# Patient Record
Sex: Male | Born: 1990 | Race: Black or African American | Hispanic: No | Marital: Single
Health system: Southern US, Community
[De-identification: ages and names within clinical notes are randomized; demographics above are authoritative.]

---
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
## 2012-08-23 ENCOUNTER — Emergency Department: Payer: Self-pay | Admitting: Emergency Medicine

## 2012-09-02 ENCOUNTER — Ambulatory Visit: Payer: Self-pay | Admitting: Orthopedic Surgery

## 2012-09-02 LAB — DRUG SCREEN, URINE
Amphetamines, Ur Screen: NEGATIVE (ref ?–1000)
Barbiturates, Ur Screen: NEGATIVE (ref ?–200)
Cannabinoid 50 Ng, Ur ~~LOC~~: POSITIVE (ref ?–50)
Cocaine Metabolite,Ur ~~LOC~~: NEGATIVE (ref ?–300)
Tricyclic, Ur Screen: NEGATIVE (ref ?–1000)

## 2013-04-11 ENCOUNTER — Emergency Department: Payer: Self-pay | Admitting: Emergency Medicine

## 2013-05-10 ENCOUNTER — Encounter: Payer: Self-pay | Admitting: Family Medicine

## 2013-05-20 IMAGING — CR RIGHT HAND - COMPLETE 3+ VIEW
1 series · 3 of 3 positions shown · non-contrast
Comparison: none

REASON FOR EXAM: injury
COMMENTS:

[Series 1: pa · 0.17mm/px · 3 of 3 slices shown]
[im 1/3]
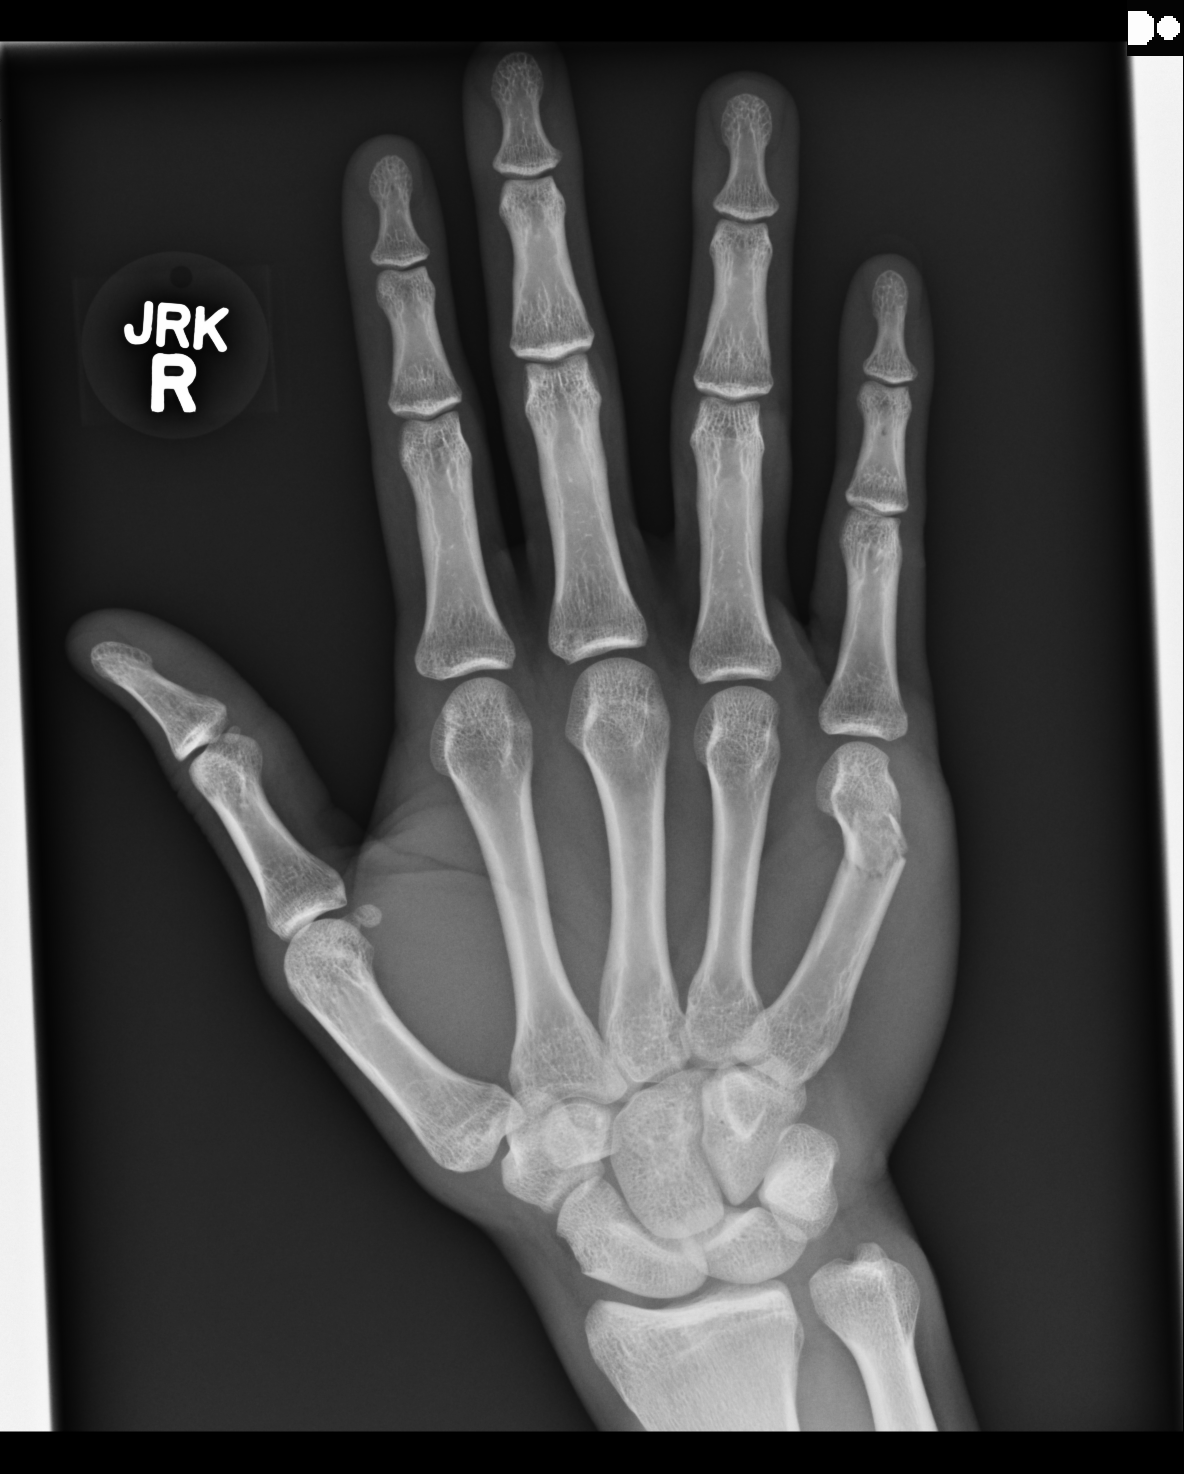
[im 2/3]
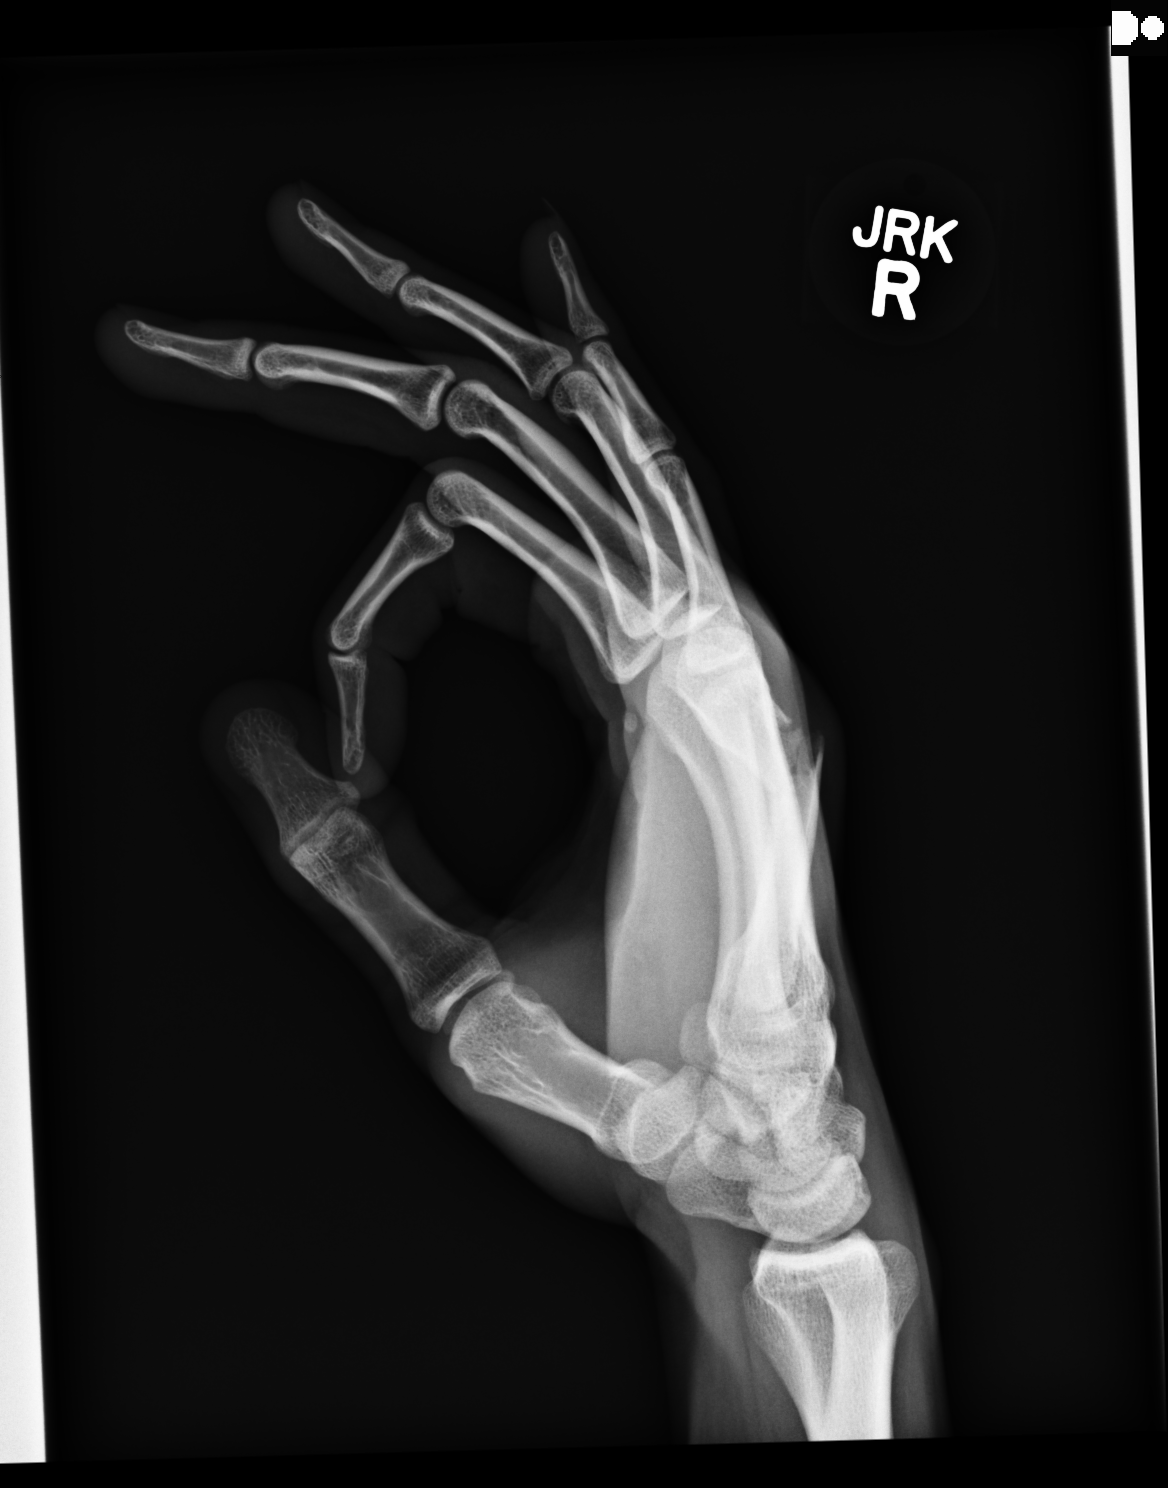
[im 3/3]
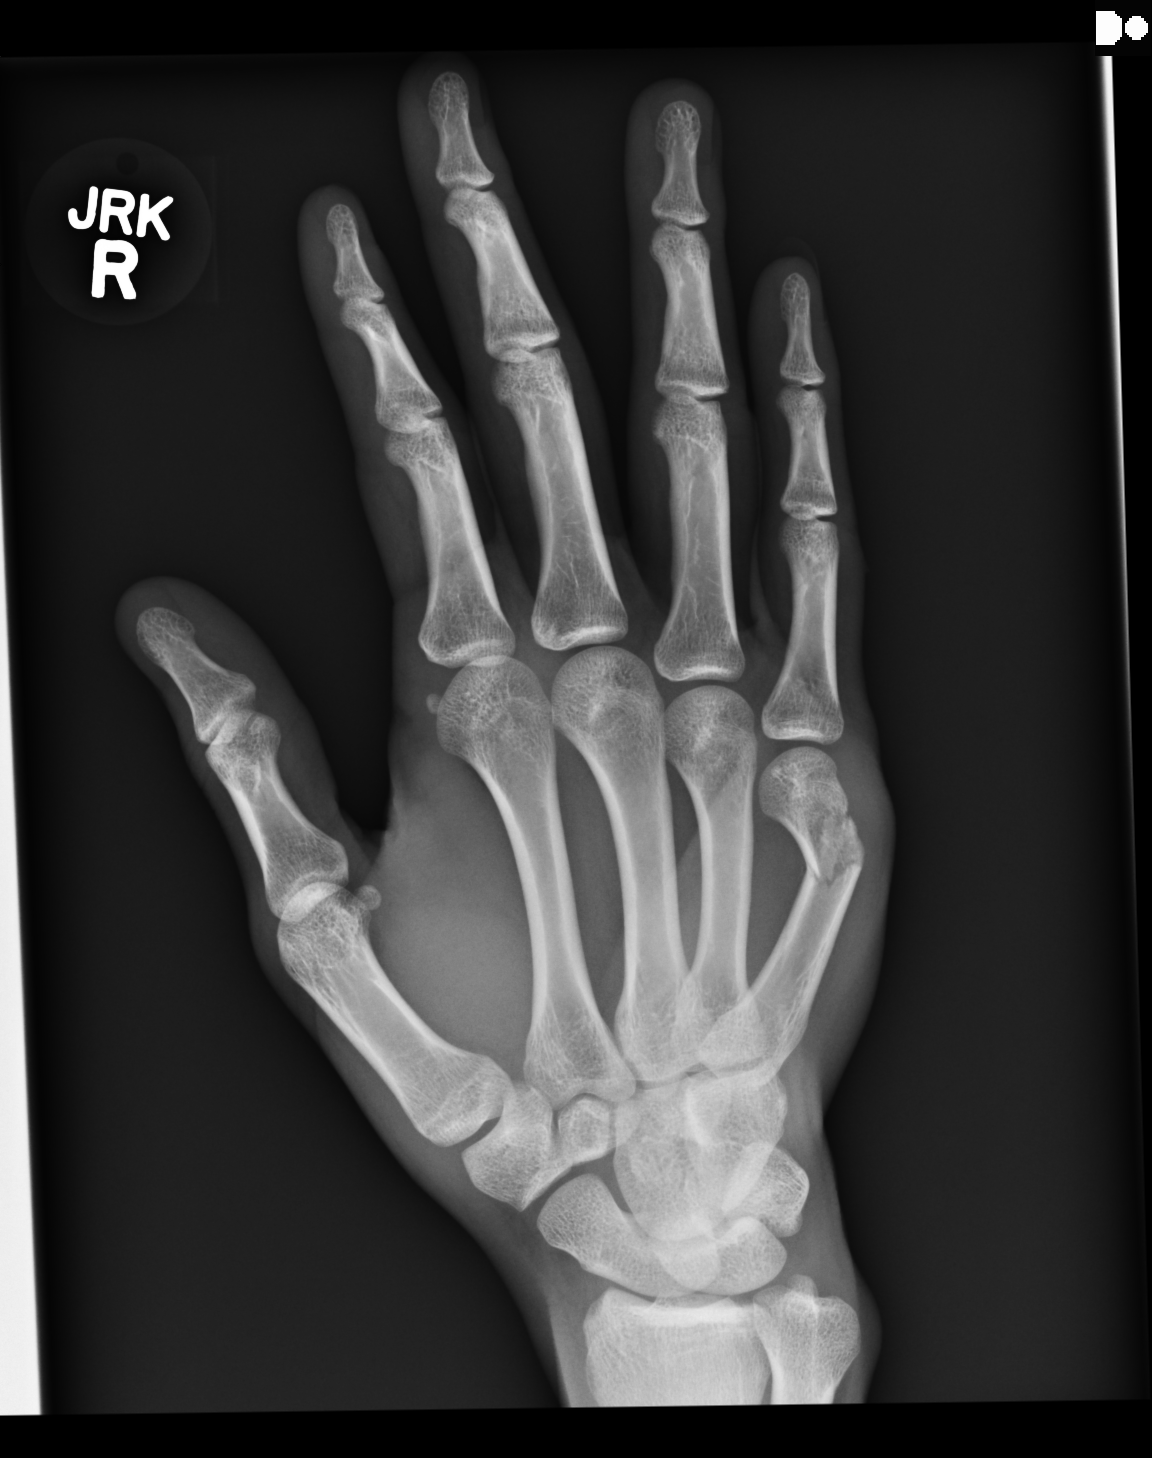

[3 of 3 positions shown; findings below may reference images not displayed]

PROCEDURE:     DXR - DXR HAND RT COMPLETE W/OBLIQUES  - August 23, 2012  [DATE]

RESULT:     Right hand images demonstrate a boxers type fracture in the
distal portion of the fifth metacarpal with dorsal angulation and a
comminuted appearance. There is slight distraction especially dorsally
because of the angulation.
IMPRESSION: Right fifth metacarpal fracture.

[REDACTED]

## 2013-05-30 IMAGING — CR RIGHT HAND - 2 VIEW
1 series · 2 of 2 positions shown · non-contrast
Comparison: none

REASON FOR EXAM: post op
COMMENTS:   LMP: (Male)

PROCEDURE:     DXR - DXR HAND RT TWO VIEWS  - September 02, 2012  [DATE]
RESULT:     The patient has undergone reduction of the distal right fifth
metacarpal fracture. Two orthopedic pins are present. The films are obtained
in plaster. There is subtle angulation at the fracture site still.

[Series 1: pa · 0.17mm/px · 2 of 2 slices shown]
[im 1/2]
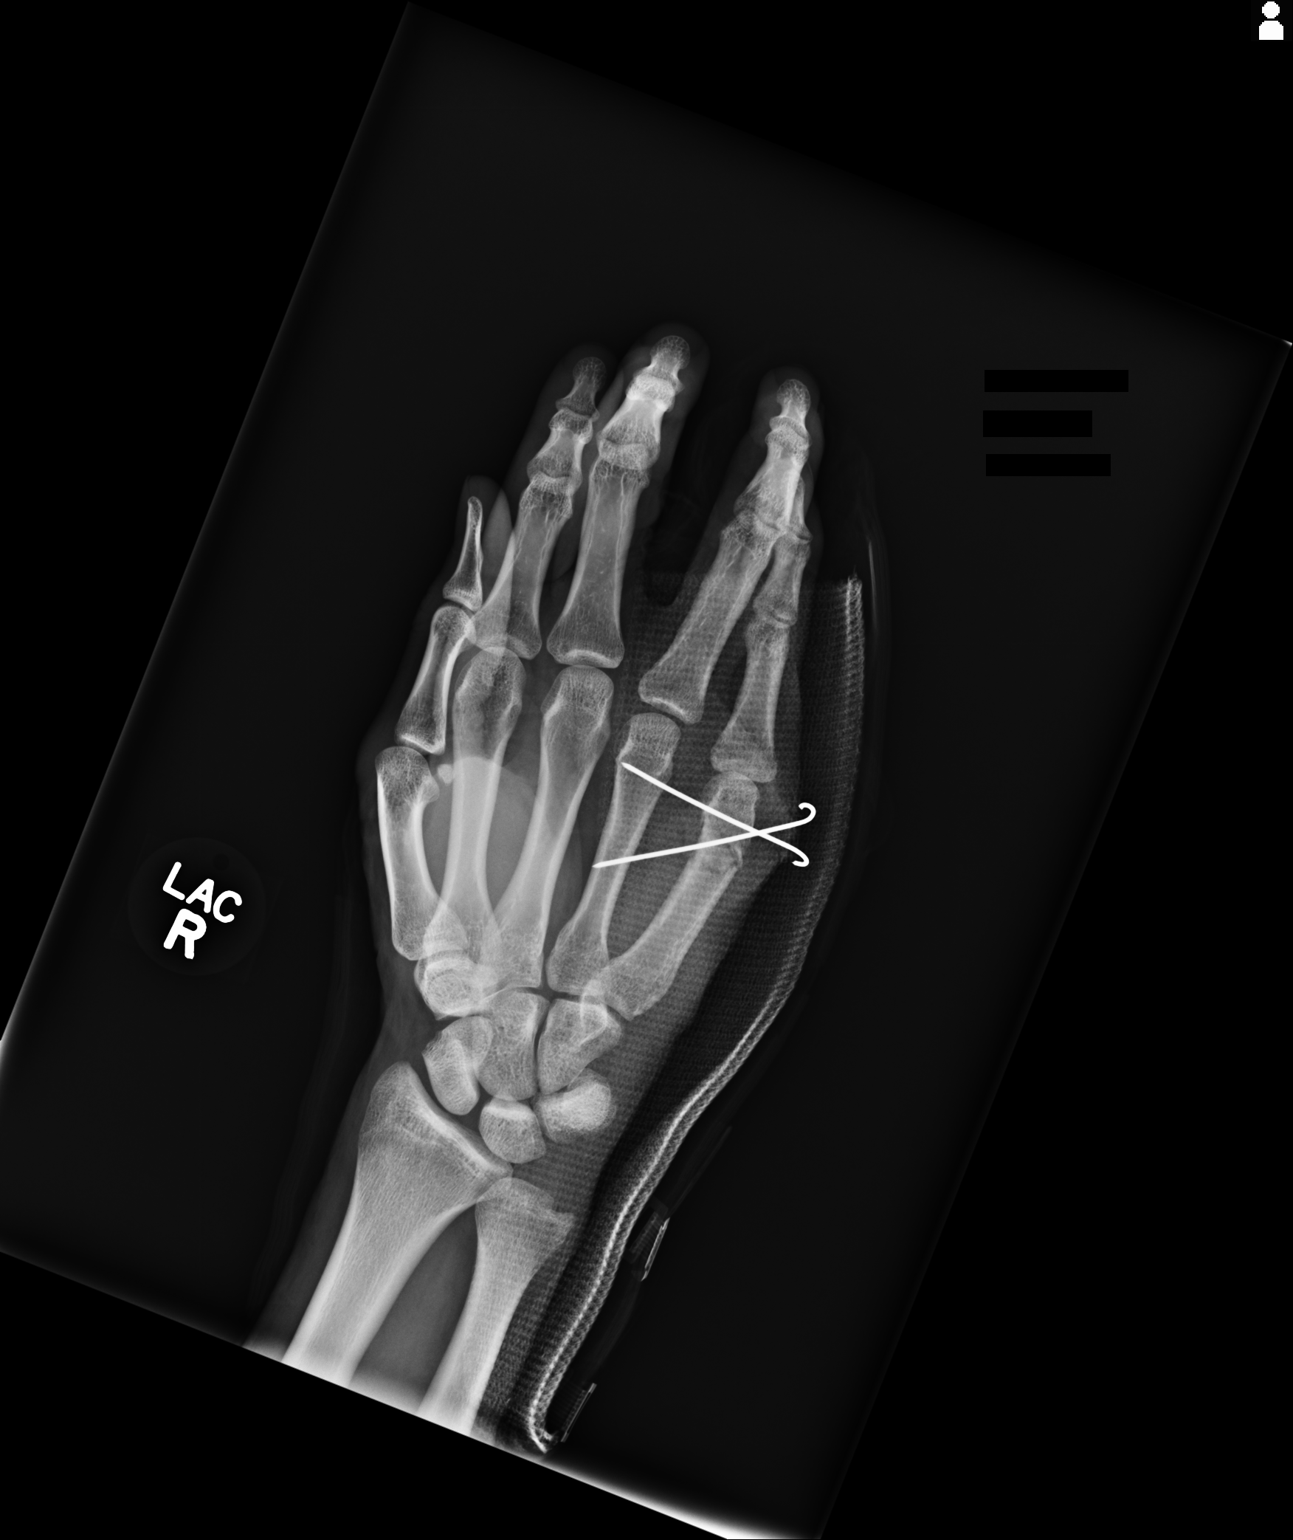
[im 2/2]
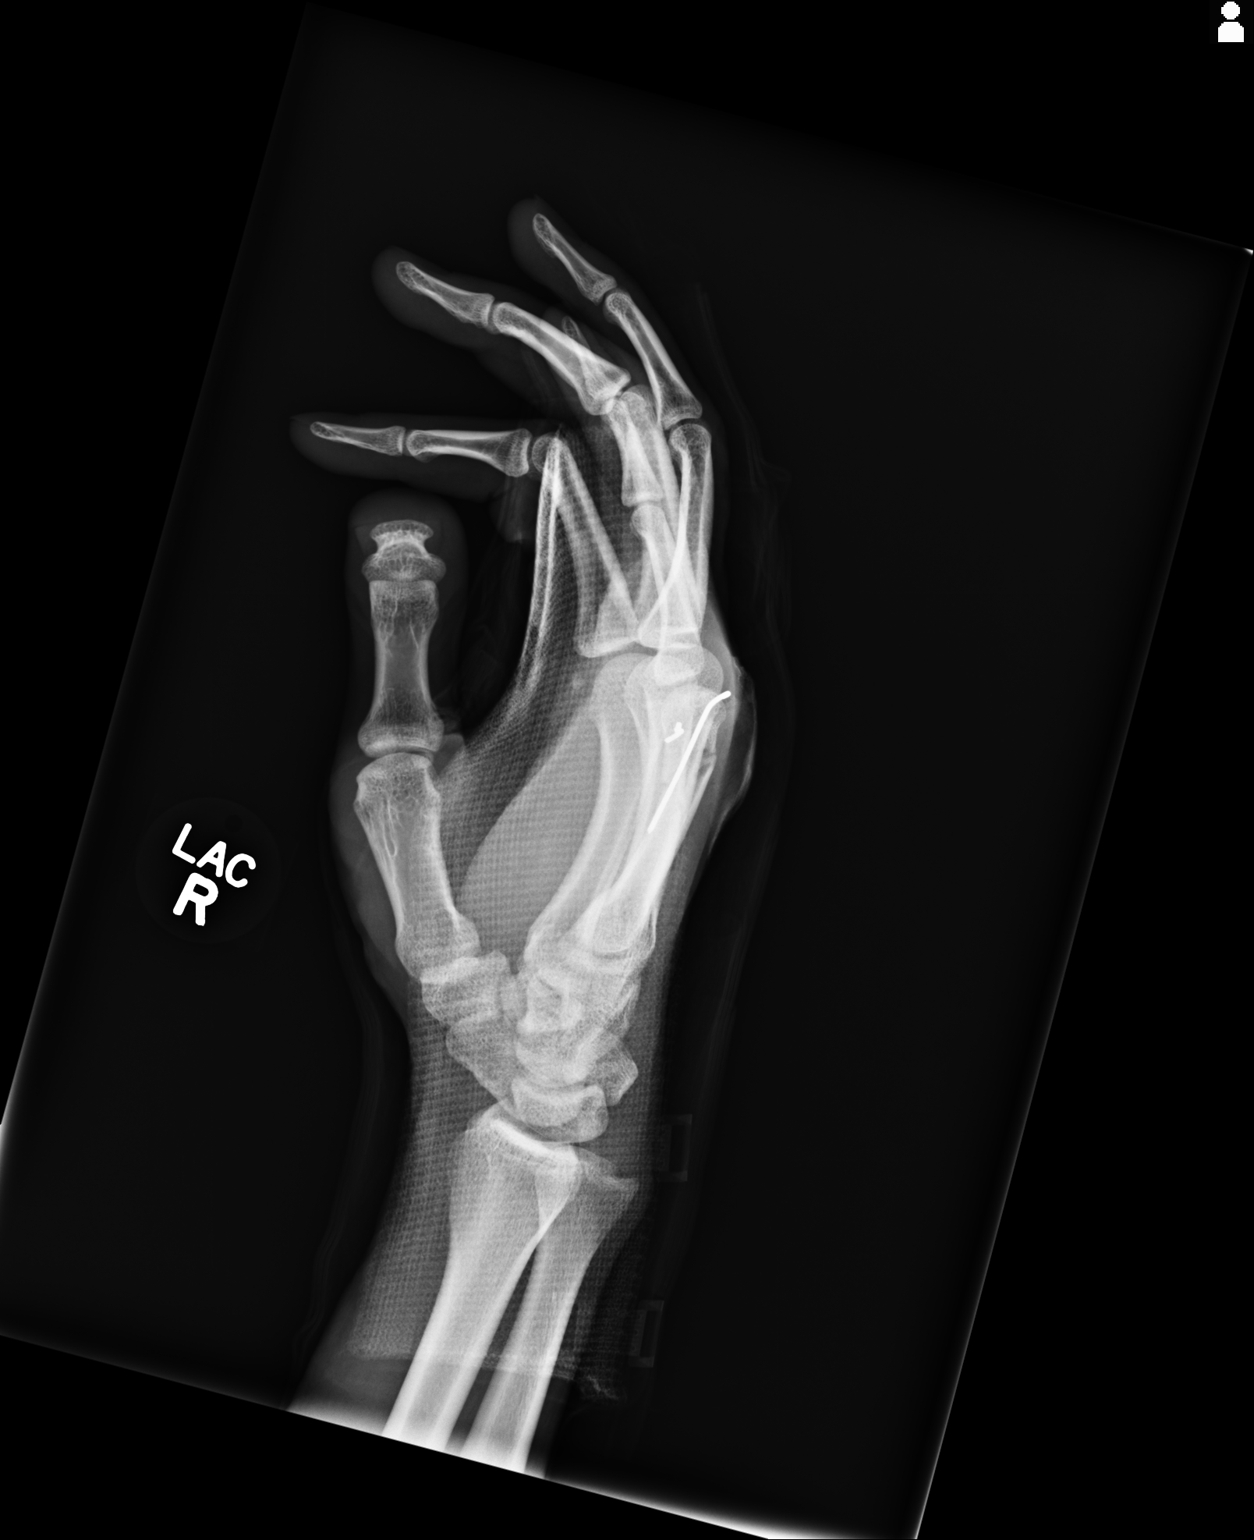

[2 of 2 positions shown; findings below may reference images not displayed]

IMPRESSION: There has been some reduction of the angulated fracture of
the distal aspect of the right fifth metacarpal. Further interpretation is
deferred to Dr. Amandeep Kaur.

[REDACTED]

## 2013-06-05 ENCOUNTER — Encounter: Payer: Self-pay | Admitting: Family Medicine
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
## 2014-08-27 NOTE — Op Note (Signed)
PATIENT NAME:  Dorian FurnaceHAITH, Banks Q MR#:  [id number] DATE OF BIRTH:  06/11/1990  DATE OF PROCEDURE:  08/17/2011  PREOPERATIVE DIAGNOSIS: Persistent pneumothorax.   POSTOPERATIVE DIAGNOSIS: Persistent pneumothorax.   PROCEDURE: Tube thoracostomy.   SURGEON: Quentin Orealph L. Ely III, MD  ANESTHESIA: Local.   DESCRIPTION OF PROCEDURE: With the patient in supine position the previous chest tube was removed and his left chest prepped with Betadine, draped with sterile towels. 20 mL of 0.5% Marcaine was injected in the area around the site of the previous tube. The rib was identified and the pleura entered with needle to help anesthetize the pleural space. 20 French chest tube with stylet was then inserted into the chest without difficulty. Air return was noted immediately. The tube was placed at 14 cm. It was attached to suction, secured in place and re-taped. Chest x-ray is pending.   ____________________________ Carmie Endalph L. Ely III, MD rle:cms D: 08/17/2011 18:12:41 ET T: 08/18/2011 08:01:58 ET JOB#: [id number]  cc: Quentin Orealph L. Ely III, MD, <Dictator> Quentin OreALPH L ELY MD ELECTRONICALLY SIGNED 08/18/2011 10:40
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
## 2014-08-27 NOTE — Op Note (Signed)
PATIENT NAME:  Thomas Hanson, Thomas Hanson MR#:  [id number] DATE OF BIRTH:  Feb 03, 1991  DATE OF PROCEDURE:  08/17/2011  PREOPERATIVE DIAGNOSIS: Left open traumatic pneumothorax.   POSTOPERATIVE DIAGNOSIS: Left open traumatic pneumothorax.   OPERATION PERFORMED: Left closed thoracostomy tube placement (anterior, 12 [place]French)   SURGEON: Claude MangesWilliam F. Mailin Coglianese, MD  ANESTHESIA: 10 mg IV morphine and local.  PROCEDURE IN DETAIL: Patient was prepped and draped in the usual sterile fashion. A small incision was made at the second intercostal space in the left midclavicular line and a 12 French trocar chest tube was placed into the chest and secured to skin with a 0 silk suture and connected to [facility] Pleur-evac suction device. Chest x-ray was obtained which showed lateral positioning of the chest tube in the chest with no worsening of the pneumothorax and still a very small stripe of pneumothorax laterally. The patient tolerated the procedure well. There were no complications.   ____________________________ Claude MangesWilliam F. Jasyah Theurer, MD wfm:cms D: 08/17/2011 00:48:25 ET T: 08/17/2011 11:09:40 ET JOB#: [id number]  cc: Claude MangesWilliam F. Joniya Boberg, MD, <Dictator> Claude MangesWILLIAM F Janei Scheff MD ELECTRONICALLY SIGNED 08/17/2011 19:32
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
## 2022-07-21 ENCOUNTER — Other Ambulatory Visit: Payer: Self-pay

## 2022-07-21 ENCOUNTER — Emergency Department
Admission: EM | Admit: 2022-07-21 | Discharge: 2022-07-21 | Disposition: A | Discharge status: Home / Self Care | Payer: BLUE CROSS/BLUE SHIELD | Attending: Emergency Medicine | Admitting: Emergency Medicine

## 2022-07-21 DIAGNOSIS — L0231 Cutaneous abscess of buttock: Secondary | ICD-10-CM | POA: Diagnosis present

## 2022-07-21 LAB — CBC WITH DIFFERENTIAL/PLATELET
Abs Immature Granulocytes: 0.04 10*3/uL (ref 0.00–0.07)
Basophils Absolute: 0.1 10*3/uL (ref 0.0–0.1)
Basophils Relative: 1 %
Eosinophils Absolute: 0.3 10*3/uL (ref 0.0–0.5)
Eosinophils Relative: 3 %
HCT: 45.5 % (ref 39.0–52.0)
Hemoglobin: 14.9 g/dL (ref 13.0–17.0)
Immature Granulocytes: 0 %
Lymphocytes Relative: 18 %
Lymphs Abs: 1.9 10*3/uL (ref 0.7–4.0)
MCH: 32.5 pg (ref 26.0–34.0)
MCHC: 32.7 g/dL (ref 30.0–36.0)
MCV: 99.3 fL (ref 80.0–100.0)
Monocytes Absolute: 0.6 10*3/uL (ref 0.1–1.0)
Monocytes Relative: 6 %
Neutro Abs: 7.6 10*3/uL (ref 1.7–7.7)
Neutrophils Relative %: 72 %
Platelets: 274 10*3/uL (ref 150–400)
RBC: 4.58 MIL/uL (ref 4.22–5.81)
RDW: 13.3 % (ref 11.5–15.5)
WBC: 10.5 10*3/uL (ref 4.0–10.5)
nRBC: 0 % (ref 0.0–0.2)

## 2022-07-21 LAB — COMPREHENSIVE METABOLIC PANEL
ALT: 17 U/L (ref 0–44)
AST: 20 U/L (ref 15–41)
Albumin: 3.7 g/dL (ref 3.5–5.0)
Alkaline Phosphatase: 57 U/L (ref 38–126)
Anion gap: 10 (ref 5–15)
BUN: 10 mg/dL (ref 6–20)
CO2: 26 mmol/L (ref 22–32)
Calcium: 8.9 mg/dL (ref 8.9–10.3)
Chloride: 104 mmol/L (ref 98–111)
Creatinine, Ser: 0.88 mg/dL (ref 0.61–1.24)
GFR, Estimated: >60 mL/min (ref >60)
Glucose, Bld: 92 mg/dL (ref 70–99)
Potassium: 4.2 mmol/L (ref 3.5–5.1)
Sodium: 140 mmol/L (ref 135–145)
Total Bilirubin: 0.6 mg/dL (ref 0.3–1.2)
Total Protein: 6.8 g/dL (ref 6.5–8.1)

## 2022-07-21 MED ORDER — SULFAMETHOXAZOLE-TRIMETHOPRIM 800-160 MG PO TABS: 1.0000 | ORAL_TABLET | Freq: Two times a day (BID) | ORAL | 0 refills | Status: AC

## 2022-07-21 MED ORDER — SULFAMETHOXAZOLE-TRIMETHOPRIM 800-160 MG PO TABS
1.0000 | ORAL_TABLET | Freq: Once | ORAL | Status: AC
  Administered 2022-07-21: 1 via ORAL
  Filled 2022-07-21: qty 1

## 2022-07-21 MED ORDER — LIDOCAINE-EPINEPHRINE (PF) 1 %-1:200000 IJ SOLN
10.0000 mL | Freq: Once | INTRAMUSCULAR | Status: AC
  Administered 2022-07-21: 10 mL
  Filled 2022-07-21: qty 10

## 2022-07-21 NOTE — ED Provider Notes (Signed)
[facility] Provider Note  Patient Contact: 3:34 PM (approximate)   History   Abscess   HPI  Thomas Hanson is a 32 y.o. male who presents the emergency department for evaluation of a possible abscess size to the left medial buttocks.  Patient has had symptoms over the last 4 to 5 days.  They have been worsening to the point the patient feels he can no longer sit down.  No fevers or chills.  This involves the buttocks but does not have any perirectal/perianal involvement according to the patient.  Has not been draining any purulent material.  Patient has had 1 other skin infection did not require any treatment.  No other complaints.     Physical Exam   Triage Vital Signs: ED Triage Vitals  Enc Vitals Group     BP 07/21/22 1317 (!) 144/88     Pulse Rate 07/21/22 1317 91     Resp 07/21/22 1317 18     Temp 07/21/22 1317 99.3 F (37.4 C)     Temp Source 07/21/22 1317 Oral     SpO2 07/21/22 1317 98 %     Weight 07/21/22 1318 190 lb (86.2 kg)     Height 07/21/22 1318 6\' 4"  (1.93 m)     Head Circumference --      Peak Flow --      Pain Score 07/21/22 1318 8     Pain Loc --      Pain Edu? --      Excl. in [place]? --     Most recent vital signs: Vitals:   07/21/22 1317  BP: (!) 144/88  Pulse: 91  Resp: 18  Temp: 99.3 F (37.4 C)  SpO2: 98%     General: Alert and in no acute distress.  Cardiovascular:  Good peripheral perfusion Respiratory: Normal respiratory effort without tachypnea or retractions. Lungs CTAB.  Musculoskeletal: Full range of motion to all extremities.  Neurologic:  No gross focal neurologic deficits are appreciated.  Skin:   No rash noted.  Visualization of the left buttocks reveals an erythematous and edematous area along the medial aspect that does not involve the perianal/perirectal region.  This is tender to palpation with induration along the outer edges with a central area of fluctuance.  There is no drainage  identified. Other:   ED Results / Procedures / Treatments   Labs (all labs ordered are listed, but only abnormal results are displayed) Labs Reviewed  CBC WITH DIFFERENTIAL/PLATELET  COMPREHENSIVE METABOLIC PANEL     EKG     RADIOLOGY    No results found.  PROCEDURES:  Critical Care performed: No  ..Incision and Drainage  Date/Time: 07/21/2022 4:26 PM  Performed by: Darletta Moll, PA-C Authorized by: Darletta Moll, PA-C   Consent:    Consent obtained:  Verbal   Consent given by:  Patient   Risks discussed:  Bleeding, incomplete drainage and pain Universal protocol:    Procedure explained and questions answered to patient or proxy's satisfaction: yes     Immediately prior to procedure, a time out was called: yes     Patient identity confirmed:  Verbally with patient Location:    Type:  Abscess   Location:  Lower extremity   Lower extremity location:  Buttock   Buttock location:  L buttock Pre-procedure details:    Skin preparation:  Antiseptic wash and povidone-iodine Sedation:    Sedation type:  None Anesthesia:    Anesthesia method:  Local infiltration   Local anesthetic:  Lidocaine 1% WITH epi Procedure type:    Complexity:  Complex Procedure details:    Ultrasound guidance: no     Needle aspiration: no     Incision types:  Single straight   Incision depth:  Subcutaneous   Wound management:  Probed and deloculated and irrigated with saline   Drainage:  Purulent   Drainage amount:  Moderate   Wound treatment:  Wound left open   Packing materials:  None Post-procedure details:    Procedure completion:  Tolerated well, no immediate complications    MEDICATIONS ORDERED IN ED: Medications  sulfamethoxazole-trimethoprim (BACTRIM DS) 800-160 MG per tablet 1 tablet (has no administration in time range)  lidocaine-EPINEPHrine (PF) (XYLOCAINE-EPINEPHrine) 1 %-1:200000 (PF) injection 10 mL (10 mLs Infiltration Given by Other 07/21/22  1544)     IMPRESSION / MDM / ASSESSMENT AND PLAN / ED COURSE  I reviewed the triage vital signs and the nursing notes.                                 Differential diagnosis includes, but is not limited to, cellulitis, abscess, perirectal abscess  Patient's presentation is most consistent with acute presentation with potential threat to life or bodily function.   Patient's diagnosis is consistent with abscess of the left buttock.  Patient presents emergency department with concerns for possible abscess to the left buttock.  Findings were consistent with abscess.  Incision and drainage performed as described above.  Patient tolerated well.  To be started on antibiotics here in the emergency department continued at home.  Concerning signs and symptoms and return precautions discussed with patient.  Otherwise he may follow-up primary care as needed..  Patient is given ED precautions to return to the ED for any worsening or new symptoms.     FINAL CLINICAL IMPRESSION(S) / ED DIAGNOSES   Final diagnoses:  Abscess of left buttock     Rx / DC Orders   ED Discharge Orders          Ordered    sulfamethoxazole-trimethoprim (BACTRIM DS) 800-160 MG tablet  2 times daily        07/21/22 1628             Note:  This document was prepared using Dragon voice recognition software and may include unintentional dictation errors.   Brynda Peon 07/21/22 1629    Harvest Dark, MD 07/21/22 2306

## 2022-07-21 NOTE — ED Triage Notes (Signed)
Pt co of abscess on left inner glute. X 1 week. Pt co of pain 8/10 while sitting. Pt states hx of abscesses.
